# Patient Record
Sex: Male | Born: 2000 | Race: White | Hispanic: Yes | Marital: Single | Smoking: Never smoker
Health system: Southern US, Community
[De-identification: ages and names within clinical notes are randomized; demographics above are authoritative.]

---
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
## 2018-06-29 ENCOUNTER — Encounter: Payer: Self-pay | Admitting: Pediatrics

## 2021-11-09 ENCOUNTER — Emergency Department (HOSPITAL_COMMUNITY)
Admission: EM | Admit: 2021-11-09 | Discharge: 2021-11-09 | Disposition: A | Discharge status: Home / Self Care | Payer: Self-pay | Attending: Emergency Medicine | Admitting: Emergency Medicine

## 2021-11-09 ENCOUNTER — Other Ambulatory Visit: Payer: Self-pay

## 2021-11-09 ENCOUNTER — Emergency Department (HOSPITAL_COMMUNITY): Payer: Self-pay

## 2021-11-09 ENCOUNTER — Encounter (HOSPITAL_COMMUNITY): Payer: Self-pay | Admitting: Emergency Medicine

## 2021-11-09 DIAGNOSIS — W540XXA Bitten by dog, initial encounter: Secondary | ICD-10-CM | POA: Insufficient documentation

## 2021-11-09 DIAGNOSIS — S51832A Puncture wound without foreign body of left forearm, initial encounter: Secondary | ICD-10-CM | POA: Insufficient documentation

## 2021-11-09 DIAGNOSIS — Z23 Encounter for immunization: Secondary | ICD-10-CM | POA: Insufficient documentation

## 2021-11-09 MED ORDER — TETANUS-DIPHTH-ACELL PERTUSSIS 5-2.5-18.5 LF-MCG/0.5 IM SUSY
0.5000 mL | PREFILLED_SYRINGE | Freq: Once | INTRAMUSCULAR | Status: AC
  Administered 2021-11-09: 0.5 mL via INTRAMUSCULAR
  Filled 2021-11-09: qty 0.5

## 2021-11-09 MED ORDER — AMOXICILLIN-POT CLAVULANATE 875-125 MG PO TABS: 1.0000 | ORAL_TABLET | Freq: Two times a day (BID) | ORAL | 0 refills | Status: AC

## 2021-11-09 NOTE — Discharge Instructions (Addendum)
Contact a health care provider if:  You have more redness, swelling, or pain around your wound.  Your wound feels warm to the touch.  You have a fever or chills.  You have a general feeling of sickness (malaise).  You feel nauseous or you vomit.  You have pain that does not get better.  Get help right away if:  You have a red streak that leads away from your wound.  You have non-clear fluid or more blood coming from your wound.  There is pus or a bad smell coming from your wound.  You have trouble moving your injured area.  You have numbness or tingling that extends beyond the wound.

## 2021-11-09 NOTE — ED Provider Notes (Signed)
?[facility] EMERGENCY DEPARTMENT ?Provider Note ? ? ?CSN: [id number] ?Arrival date & time: 11/09/21  1640 ? ?  ? ?History ? ?Chief Complaint  ?Patient presents with  ? Animal Bite  ? ? ?Caleb Brewer is a 21 y.o. male presents emergency department chief complaint of left forearm dog bite.  Patient was bitten by his neighbors dog who is up-to-date on his rabies vaccination.  Patient is not up-to-date on his tetanus vaccine.  He is right-hand dominant.  He denies any numbness tingling or weakness of the forearm.  He has 2 small puncture wounds. ? ? ?Animal Bite ? ?  ? ?Home Medications ?Prior to Admission medications   ?Medication Sig Start Date End Date Taking? Authorizing Provider  ?cetirizine (ZYRTEC) 10 MG tablet Take 1 tablet (10 mg total) by mouth daily. 08/29/15   McDonell, Alfredia Client, MD  ?EPINEPHrine (EPIPEN 2-PAK) 0.3 mg/0.3 mL IJ SOAJ injection Inject 0.3 mLs (0.3 mg total) into the muscle once. 08/29/15   McDonell, Alfredia Client, MD  ?fluticasone (FLONASE) 50 MCG/ACT nasal spray Place 2 sprays into the nose daily. 06/19/13   Laurell Josephs, MD  ?ibuprofen (ADVIL,MOTRIN) 600 MG tablet Take 1 tablet (600 mg total) by mouth 3 (three) times daily. Take with food 12/12/17   Pauline Aus, PA-C  ?   ? ?Allergies    ?Shellfish allergy   ? ?Review of Systems   ?Review of Systems ? ?Physical Exam ?Updated Vital Signs ?BP 122/73   Pulse (!) 58   Temp 98.3 ?F (36.8 ?C) (Oral)   Resp 18   Ht 5\' 6"  (1.676 m)   Wt 90.7 kg   SpO2 98%   BMI 32.28 kg/m?  ?Physical Exam ?Vitals and nursing note reviewed.  ?Constitutional:   ?   General: He is not in acute distress. ?   Appearance: He is well-developed. He is not diaphoretic.  ?HENT:  ?   Head: Normocephalic and atraumatic.  ?Eyes:  ?   General: No scleral icterus. ?   Conjunctiva/sclera: Conjunctivae normal.  ?Cardiovascular:  ?   Rate and Rhythm: Normal rate and regular rhythm.  ?   Heart sounds: Normal heart sounds.  ?Pulmonary:  ?   Effort: Pulmonary effort is normal.  No respiratory distress.  ?   Breath sounds: Normal breath sounds.  ?Abdominal:  ?   Palpations: Abdomen is soft.  ?   Tenderness: There is no abdominal tenderness.  ?Musculoskeletal:  ?   Cervical back: Normal range of motion and neck supple.  ?   Comments: Left forearm with 2 small puncture wounds 1 on the dorsal and 1 on the ventral surface of the forearm.  There is small abrasions on the forearm consistent with bite marks.  No active bleeding  ?Skin: ?   General: Skin is warm and dry.  ?Neurological:  ?   Mental Status: He is alert.  ?Psychiatric:     ?   Behavior: Behavior normal.  ? ? ?ED Results / Procedures / Treatments   ?Labs ?(all labs ordered are listed, but only abnormal results are displayed) ?Labs Reviewed - No data to display ? ?EKG ?None ? ?Radiology ?DG Wrist Complete Left ? ?Result Date: 11/09/2021 ?CLINICAL DATA:  Dog bite EXAM: LEFT WRIST - COMPLETE 3+ VIEW COMPARISON:  None. FINDINGS: There is no evidence of fracture or dislocation. There is no evidence of arthropathy or other focal bone abnormality. Soft tissue edema about the wrist with lacerations at both the anterior and posterior aspects  of the distal forearm. No radiopaque foreign body. IMPRESSION: 1. No fracture or dislocation of the left wrist. The carpus is normally aligned. 2. Soft tissue edema about the wrist with lacerations at both the anterior and posterior aspects of the distal forearm. No radiopaque foreign body. Electronically Signed   By: Jearld Lesch M.D.   On: 11/09/2021 19:16   ? ?Procedures ?Procedures  ? ? ?Medications Ordered in ED ?Medications  ?Tdap (BOOSTRIX) injection 0.5 mL (has no administration in time range)  ? ? ?ED Course/ Medical Decision Making/ A&P ?  ?                        ?Medical Decision Making ?Patient with dog bite to the left forearm.  I personally cleaned and bandaged the patient's forearm.  Patient's tetanus vaccine updated.  No evidence of vascular or neurologic compromise after injury.  I  personally visualized the patient's forearm x-ray which shows no acute findings or fractures, no retained foreign bodies.  Agree with radiologic interpretation.  Patient be discharged with Augmentin.  Outpatient follow-up with PCP, discussed return precautions. ? ?Amount and/or Complexity of Data Reviewed ?Radiology: ordered and independent interpretation performed. ? ?Risk ?Prescription drug management. ? ? ? ?Final Clinical Impression(s) / ED Diagnoses ?Final diagnoses:  ?None  ? ? ?Rx / DC Orders ?ED Discharge Orders   ? ? None  ? ?  ? ? ?  ?Arthor Captain, PA-C ?11/09/21 2113 ? ?  ?Terrilee Files, MD ?11/10/21 1113 ? ?

## 2021-11-09 NOTE — ED Triage Notes (Signed)
Pt presents today c/o dog bite to left lower forearm, occurred about 30 min ago, bleeding controlled. Pt states dog has had its rabies vaccines. Pt does not know when his last tetanus shot was. 2 puncture wounds to left wrist ?

## 2023-09-05 IMAGING — DX DG WRIST COMPLETE 3+V*L*
4 series · 4 of 4 positions shown · non-contrast
Comparison: None.

CLINICAL DATA: Dog bite

EXAM:
LEFT WRIST - COMPLETE 3+ VIEW

[wrist pa]
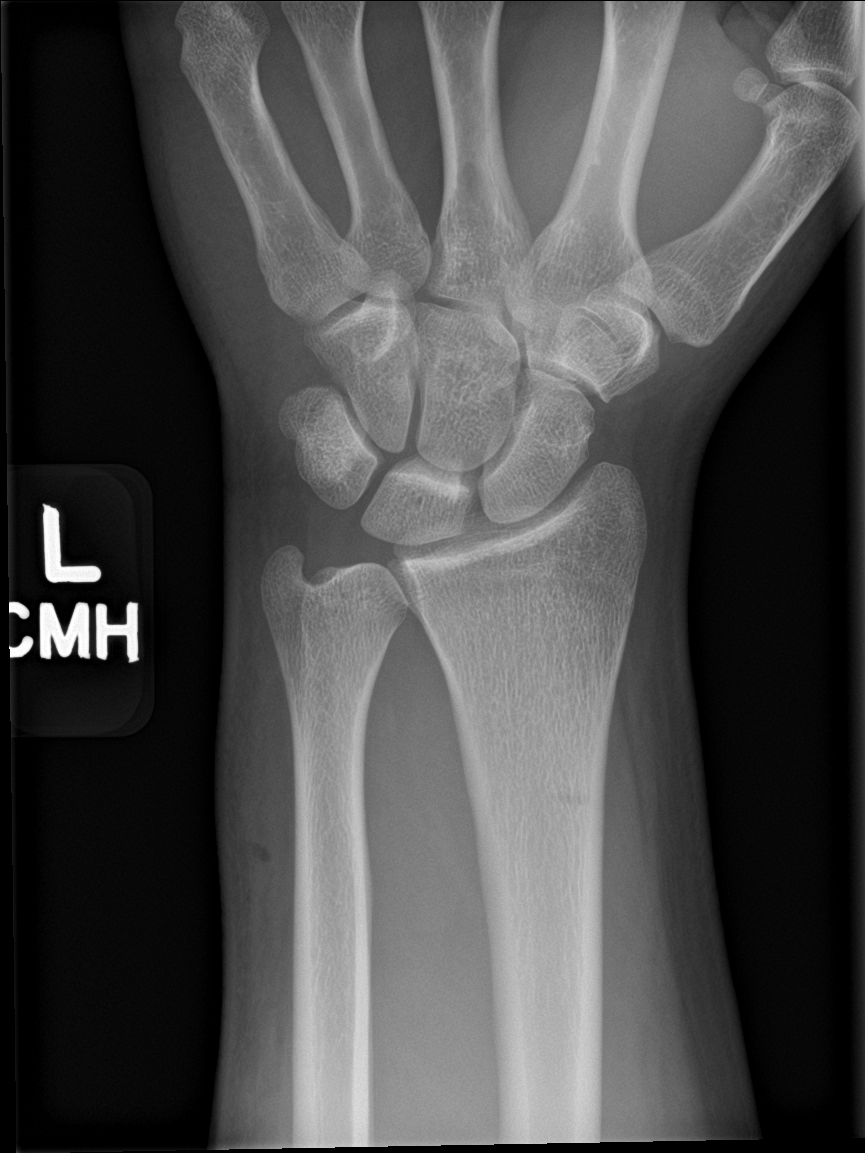

[wrist obl]
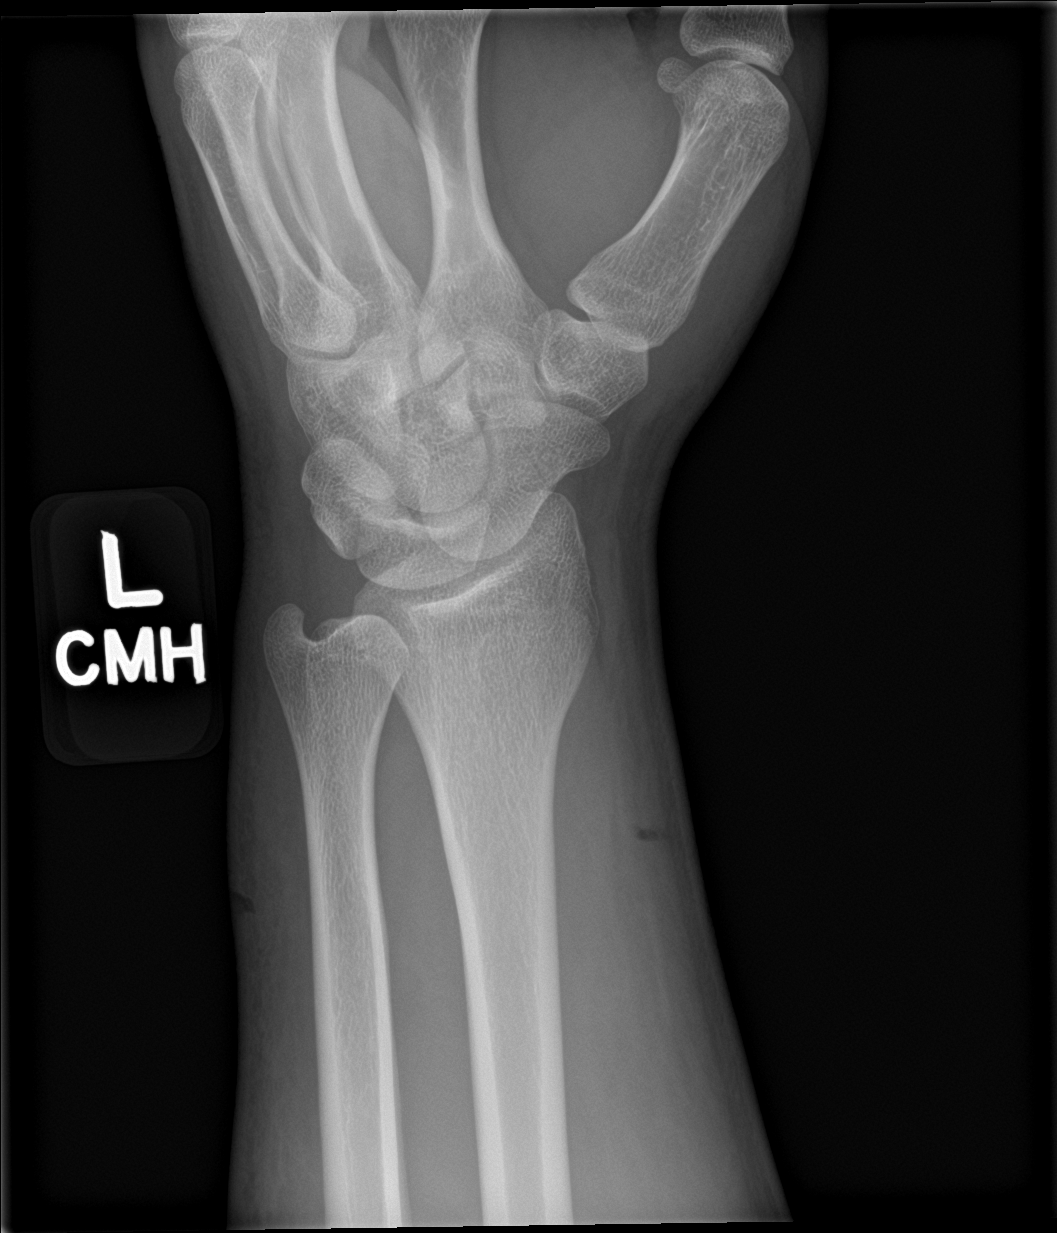

[wrist lat]
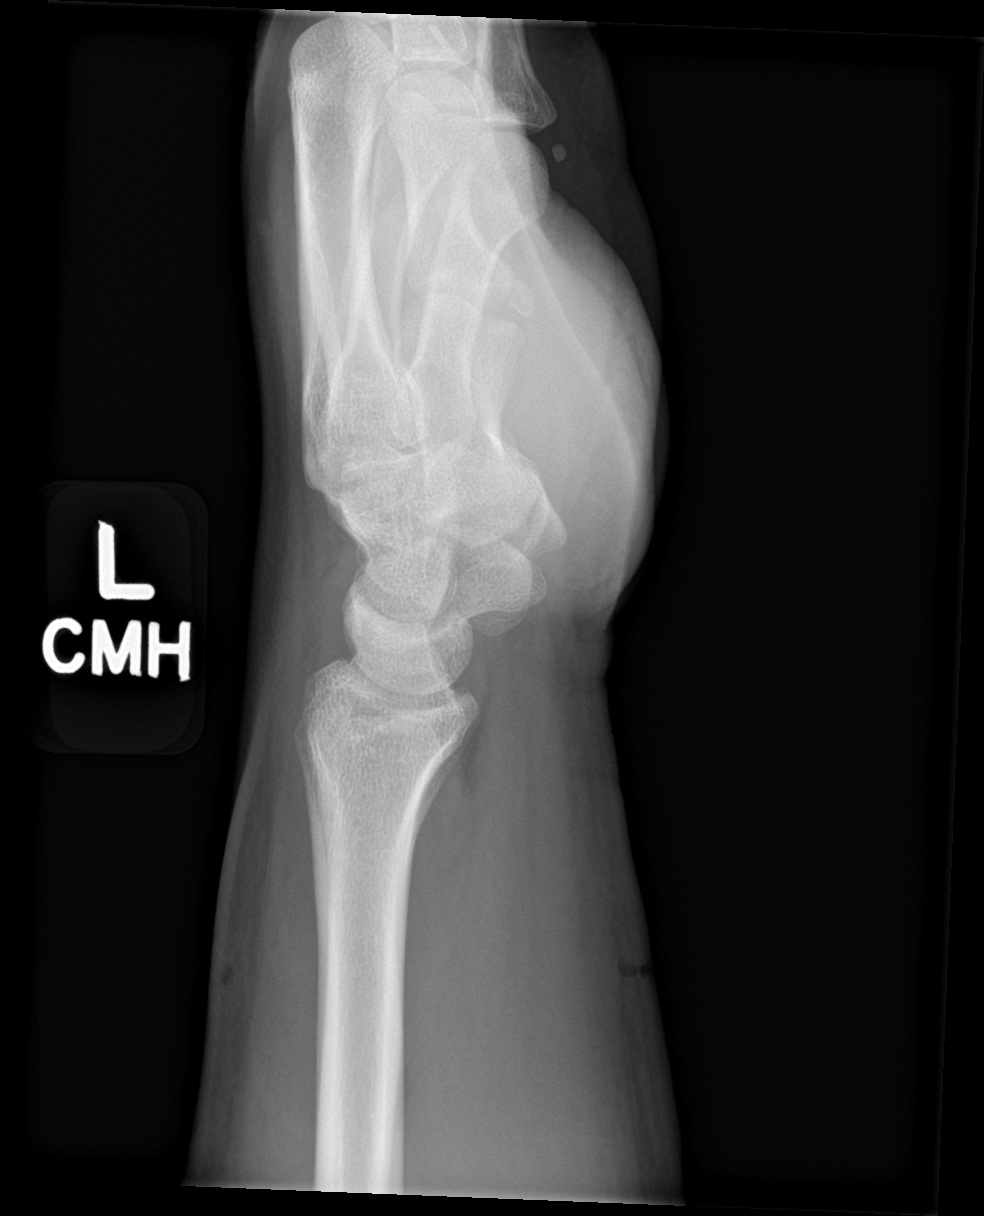

[wrist navicular]
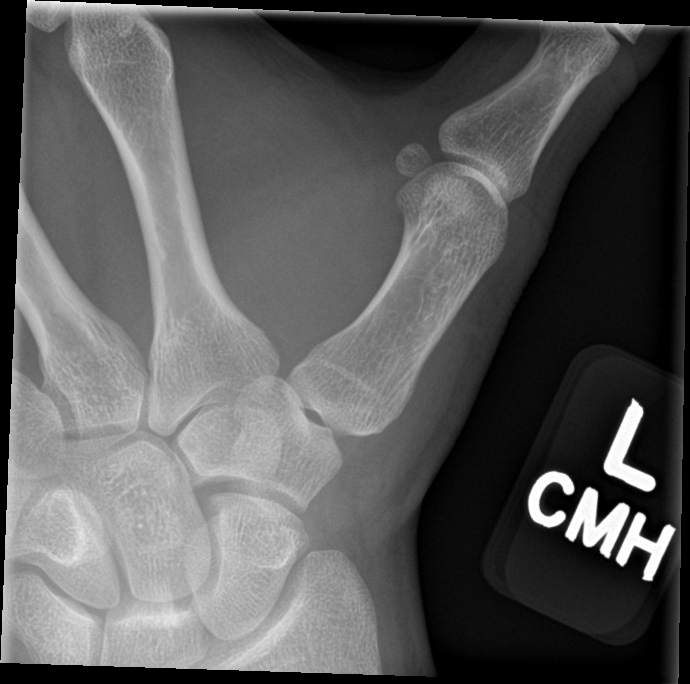

[4 of 4 positions shown; findings below may reference images not displayed]

FINDINGS: There is no evidence of fracture or dislocation. There is no
evidence of arthropathy or other focal bone abnormality. Soft tissue
edema about the wrist with lacerations at both the anterior and
posterior aspects of the distal forearm. No radiopaque foreign body.
IMPRESSION: 1. No fracture or dislocation of the left wrist. The carpus is
normally aligned.
2. Soft tissue edema about the wrist with lacerations at both the
anterior and posterior aspects of the distal forearm. No radiopaque
foreign body.
